# Patient Record
Sex: Male | Born: 2000 | Race: White | Hispanic: No | Marital: Single
Health system: Southern US, Community
[De-identification: ages and names within clinical notes are randomized; demographics above are authoritative.]

## PROBLEM LIST (undated history)

## (undated) HISTORY — PX: EYE SURGERY: SHX253

---
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
## 2023-01-23 ENCOUNTER — Other Ambulatory Visit: Payer: Self-pay

## 2023-01-23 DIAGNOSIS — R509 Fever, unspecified: Secondary | ICD-10-CM | POA: Diagnosis present

## 2023-01-23 DIAGNOSIS — J111 Influenza due to unidentified influenza virus with other respiratory manifestations: Secondary | ICD-10-CM | POA: Insufficient documentation

## 2023-01-23 DIAGNOSIS — Z1152 Encounter for screening for COVID-19: Secondary | ICD-10-CM | POA: Insufficient documentation

## 2023-01-23 LAB — RESP PANEL BY RT-PCR (RSV, FLU A&B, COVID)  RVPGX2
Influenza A by PCR: NEGATIVE
Influenza B by PCR: NEGATIVE
Resp Syncytial Virus by PCR: NEGATIVE
SARS Coronavirus 2 by RT PCR: NEGATIVE

## 2023-01-23 LAB — GROUP A STREP BY PCR: Group A Strep by PCR: NOT DETECTED

## 2023-01-23 MED ORDER — ACETAMINOPHEN 325 MG PO TABS
650.0000 mg | ORAL_TABLET | Freq: Once | ORAL | Status: AC
  Administered 2023-01-23: 650 mg via ORAL
  Filled 2023-01-23: qty 2

## 2023-01-23 NOTE — ED Triage Notes (Signed)
Pt reports headache, sore throat, cough, and fever x 1 day. Last took Ibuprofen x 4 hours ago. Pt is febrile in triage. Orders placed

## 2023-01-24 ENCOUNTER — Emergency Department (HOSPITAL_BASED_OUTPATIENT_CLINIC_OR_DEPARTMENT_OTHER)
Admission: EM | Admit: 2023-01-24 | Discharge: 2023-01-24 | Disposition: A | Discharge status: Home / Self Care | Payer: No Typology Code available for payment source | Attending: Emergency Medicine | Admitting: Emergency Medicine

## 2023-01-24 DIAGNOSIS — J111 Influenza due to unidentified influenza virus with other respiratory manifestations: Secondary | ICD-10-CM

## 2023-01-24 MED ORDER — DEXAMETHASONE 4 MG PO TABS
10.0000 mg | ORAL_TABLET | Freq: Once | ORAL | Status: AC
  Administered 2023-01-24: 10 mg via ORAL
  Filled 2023-01-24: qty 3

## 2023-01-24 NOTE — ED Provider Notes (Signed)
   EMERGENCY DEPARTMENT AT [facility] Provider Note   CSN: [id number] Arrival date & time: 01/23/23  2230     History  Chief Complaint  Patient presents with   Fever    Greg Fleming is a 22 y.o. male.  The history is provided by the patient.  Fever He had onset this morning of a headache and fever which was as high as 104.2.  He has had associated chills but no sweats.  He is complaining of some clear rhinorrhea and a sore throat and cough productive of clear sputum.  He also is complaining of generalized bodyaches.  He denies nausea, vomiting, diarrhea.  He denies any sick contacts.  He did take acetaminophen at home with partial relief of fever.   Home Medications Prior to Admission medications   Not on File      Allergies    Patient has no known allergies.    Review of Systems   Review of Systems  Constitutional:  Positive for fever.  All other systems reviewed and are negative.   Physical Exam Updated Vital Signs BP 120/76   Pulse 79   Temp 98.7 F (37.1 C) (Oral)   Resp 18   Ht 6' (1.829 m)   Wt 65.8 kg   SpO2 98%   BMI 19.67 kg/m  Physical Exam Vitals and nursing note reviewed.   22 year old male, resting comfortably and in no acute distress. Vital signs are significant for initial fever which has come down following acetaminophen administration. Oxygen saturation is 98%, which is normal. Head is normocephalic and atraumatic. PERRLA, EOMI. Oropharynx is mildly erythematous without tonsillar hypertrophy or exudate. Neck is nontender and supple without adenopathy . Lungs are clear without rales, wheezes, or rhonchi. Chest is nontender. Heart has regular rate and rhythm without murmur. Abdomen is soft, flat, nontender. Extremities have no cyanosis or edema, full range of motion is present. Skin is warm and dry without rash. Neurologic: Mental status is normal, cranial nerves are intact, moves all extremities equally.  ED Results /  Procedures / Treatments   Labs (all labs ordered are listed, but only abnormal results are displayed) Labs Reviewed  GROUP A STREP BY PCR  RESP PANEL BY RT-PCR (RSV, FLU A&B, COVID)  [id number]   Procedures Procedures    Medications Ordered in ED Medications  acetaminophen (TYLENOL) tablet 650 mg (650 mg Oral Given 01/23/23 2252)    ED Course/ Medical Decision Making/ A&P                             Medical Decision Making Risk OTC drugs.   Influenza-like illness.  Consider streptococcal pharyngitis.  He was given acetaminophen and triage with good control of fever.  I have reviewed and interpreted his laboratory tests and my interpretation is negative PCR for group A strep, RSV, influenza, COVID-19.  Infection is from a different virus, will need to be treated symptomatically.  I have ordered a dose of dexamethasone to help with pharyngitis.  I have advised the patient to drink plenty of fluids and use over-the-counter ibuprofen and acetaminophen for fever and aching.  Final Clinical Impression(s) / ED Diagnoses Final diagnoses:  Influenza-like illness    Rx / DC Orders ED Discharge Orders     None         Delora Fuel, MD 0000000 [phone]

## 2023-01-24 NOTE — Discharge Instructions (Addendum)
Drink plenty of fluids.  Take acetaminophen and/your ibuprofen as needed for fever or aching.
